# Patient Record
Sex: Male | Born: 1980 | Race: Black or African American | Hispanic: No | Marital: Single | Smoking: Former smoker
Health system: Southern US, Community
[De-identification: ages and names within clinical notes are randomized; demographics above are authoritative.]

## PROBLEM LIST (undated history)

## (undated) DIAGNOSIS — J45909 Unspecified asthma, uncomplicated: Secondary | ICD-10-CM

## (undated) HISTORY — PX: FOOT SURGERY: SHX648

## (undated) HISTORY — DX: Unspecified asthma, uncomplicated: J45.909

## (undated) HISTORY — PX: WISDOM TOOTH EXTRACTION: SHX21

---

## 2020-10-12 ENCOUNTER — Emergency Department (HOSPITAL_BASED_OUTPATIENT_CLINIC_OR_DEPARTMENT_OTHER)
Admission: EM | Admit: 2020-10-12 | Discharge: 2020-10-12 | Disposition: A | Discharge status: Home / Self Care | Payer: BC Managed Care – PPO | Attending: Emergency Medicine | Admitting: Emergency Medicine

## 2020-10-12 ENCOUNTER — Other Ambulatory Visit: Payer: Self-pay

## 2020-10-12 ENCOUNTER — Encounter (HOSPITAL_BASED_OUTPATIENT_CLINIC_OR_DEPARTMENT_OTHER): Payer: Self-pay | Admitting: Emergency Medicine

## 2020-10-12 ENCOUNTER — Emergency Department (HOSPITAL_BASED_OUTPATIENT_CLINIC_OR_DEPARTMENT_OTHER): Payer: BC Managed Care – PPO

## 2020-10-12 DIAGNOSIS — J45909 Unspecified asthma, uncomplicated: Secondary | ICD-10-CM | POA: Diagnosis not present

## 2020-10-12 DIAGNOSIS — S61011A Laceration without foreign body of right thumb without damage to nail, initial encounter: Secondary | ICD-10-CM

## 2020-10-12 DIAGNOSIS — Z87891 Personal history of nicotine dependence: Secondary | ICD-10-CM | POA: Insufficient documentation

## 2020-10-12 DIAGNOSIS — W260XXA Contact with knife, initial encounter: Secondary | ICD-10-CM | POA: Insufficient documentation

## 2020-10-12 DIAGNOSIS — Z23 Encounter for immunization: Secondary | ICD-10-CM | POA: Insufficient documentation

## 2020-10-12 MED ORDER — LIDOCAINE HCL (PF) 1 % IJ SOLN
5.0000 mL | Freq: Once | INTRAMUSCULAR | Status: AC
  Administered 2020-10-12: 11:00:00 5 mL
  Filled 2020-10-12: qty 5

## 2020-10-12 MED ORDER — TETANUS-DIPHTH-ACELL PERTUSSIS 5-2.5-18.5 LF-MCG/0.5 IM SUSY
0.5000 mL | PREFILLED_SYRINGE | Freq: Once | INTRAMUSCULAR | Status: AC
  Administered 2020-10-12: 11:00:00 0.5 mL via INTRAMUSCULAR
  Filled 2020-10-12: qty 0.5

## 2020-10-12 MED ORDER — CEPHALEXIN 500 MG PO CAPS: 500.0000 mg | ORAL_CAPSULE | Freq: Three times a day (TID) | ORAL | 0 refills | Status: AC

## 2020-10-12 NOTE — ED Provider Notes (Signed)
[facility] EMERGENCY DEPARTMENT Provider Note   CSN: [id number] Arrival date & time: 10/12/20  1032     History Chief Complaint  Patient presents with  . Laceration    Corey Grant is a 39 y.o. male  Patient presents to the ER today for laceration that occurred just prior to arrival.  He was closing his pocket knife when it slipped and he lacerated the pad of his right thumb.  He reports immediate onset sharp pain mild constant gradually improving with time pain does not radiate, no medication use prior to arrival.  Reports last tetanus shot over 10 years ago.  Denies any numbness/weakness, tingling, range of motion limitations or any additional concerns.  Of note patient reports that this is an old pocketknife that he was using.  HPI     Past Medical History:  Diagnosis Date  . Asthma     There are no problems to display for this patient.        No family history on file.  Social History   Tobacco Use  . Smoking status: Former Games developer  . Smokeless tobacco: Never Used  Vaping Use  . Vaping Use: Every day  Substance Use Topics  . Alcohol use: Yes    Alcohol/week: 2.0 standard drinks    Types: 2 Cans of beer per week    Comment: daily  . Drug use: Never    Home Medications Prior to Admission medications   Medication Sig Start Date End Date Taking? Authorizing Provider  cephALEXin (KEFLEX) 500 MG capsule Take 1 capsule (500 mg total) by mouth 3 (three) times daily for 7 days. 10/12/20 10/19/20  Bill Salinas, PA-C    Allergies    Patient has no allergy information on record.  Review of Systems   Review of Systems  Constitutional: Negative.  Negative for chills and fever.  Musculoskeletal: Negative.  Negative for arthralgias.  Skin: Positive for wound.  Neurological: Negative.  Negative for weakness and numbness.    Physical Exam Updated Vital Signs BP 135/90 (BP Location: Right Arm)   Pulse 90   Temp (!) 97.4 F (36.3 C) (Oral)    Resp 18   Ht 5\' 10"  (1.778 m)   Wt 98.9 kg   SpO2 100%   BMI 31.28 kg/m   Physical Exam Constitutional:      General: He is not in acute distress.    Appearance: Normal appearance. He is well-developed. He is not ill-appearing or diaphoretic.  HENT:     Head: Normocephalic and atraumatic.  Eyes:     General: Vision grossly intact. Gaze aligned appropriately.     Pupils: Pupils are equal, round, and reactive to light.  Neck:     Trachea: Trachea and phonation normal.  Cardiovascular:     Rate and Rhythm: Normal rate and regular rhythm.     Pulses:          Radial pulses are 2+ on the right side and 2+ on the left side.  Pulmonary:     Effort: Pulmonary effort is normal. No respiratory distress.  Abdominal:     General: There is no distension.     Palpations: Abdomen is soft.     Tenderness: There is no abdominal tenderness. There is no guarding or rebound.  Musculoskeletal:        General: Normal range of motion.       Hands:     Cervical back: Normal range of motion.  Comments: Right hand: 1.5 cm linear laceration of the pad of the right thumb extending from the center middle of the pad up to the very edge of the fingernail.  No other injuries.  Otherwise fingers appear normal.  Minimal tenderness to palpation over laceration. No snuffbox tenderness to palpation. No tenderness to palpation over flexor sheath.  Finger adduction/abduction intact with 5/5 strength.  Thumb opposition intact. Full active and resisted ROM to flexion/extension at wrist, MCP, PIP and DIP of all fingers.  FDS/FDP intact. Grip 5/5 strength.  Radial artery 2+ with <2sec cap refill in all fingers.  Sensation intact to light-tough in median/ulnar/radial distributions.  Skin:    General: Skin is warm and dry.  Neurological:     Mental Status: He is alert.     GCS: GCS eye subscore is 4. GCS verbal subscore is 5. GCS motor subscore is 6.     Comments: Speech is clear and goal oriented, follows  commands Major Cranial nerves without deficit, no facial droop Moves extremities without ataxia, coordination intact  Psychiatric:        Behavior: Behavior normal.     ED Results / Procedures / Treatments   Labs (all labs ordered are listed, but only abnormal results are displayed) Labs Reviewed - No data to display  EKG None  Radiology DG Finger Thumb Right  Result Date: 10/12/2020 CLINICAL DATA:  Right thumb laceration EXAM: RIGHT THUMB 2+V COMPARISON:  None. FINDINGS: There is no evidence of fracture or dislocation. There is no evidence of arthropathy or other focal bone abnormality. Soft tissues are unremarkable. No radiopaque foreign body. IMPRESSION: Negative. Electronically Signed   By: Duanne GuessNicholas  Plundo D.O.   On: 10/12/2020 11:13    Procedures .Marland Kitchen.Laceration Repair  Date/Time: 10/12/2020 11:54 AM Performed by: Bill SalinasMorelli, Josely Moffat A, PA-C Authorized by: Bill SalinasMorelli, Halina Asano A, PA-C   Consent:    Consent obtained:  Verbal   Consent given by:  Patient and spouse   Risks, benefits, and alternatives were discussed: yes     Risks discussed:  Pain, poor cosmetic result, poor wound healing, vascular damage, tendon damage, retained foreign body, infection, need for additional repair and nerve damage Universal protocol:    Procedure explained and questions answered to patient or proxy's satisfaction: yes     Relevant documents present and verified: yes     Test results available: yes     Imaging studies available: yes     Required blood products, implants, devices, and special equipment available: yes     Site/side marked: yes     Immediately prior to procedure, a time out was called: yes     Patient identity confirmed:  Verbally with patient and arm band Anesthesia:    Anesthesia method:  Nerve block   Block location:  Digital Block Right Thumb   Block needle gauge:  25 G   Block anesthetic:  Lidocaine 1% w/o epi   Block technique:  Digital   Block injection procedure:   Anatomic landmarks identified, anatomic landmarks palpated, negative aspiration for blood, introduced needle and incremental injection   Block outcome:  Anesthesia achieved Laceration details:    Location:  Finger   Finger location:  R thumb   Length (cm):  1.5   Depth (mm):  3 Pre-procedure details:    Preparation:  Patient was prepped and draped in usual sterile fashion and imaging obtained to evaluate for foreign bodies Exploration:    Limited defect created (wound extended): no     Imaging  obtained: x-ray     Imaging outcome: foreign body not noted     Wound exploration: wound explored through full range of motion and entire depth of wound visualized     Wound extent: no foreign bodies/material noted, no muscle damage noted, no nerve damage noted, no tendon damage noted, no underlying fracture noted and no vascular damage noted     Contaminated: no   Treatment:    Area cleansed with:  Povidone-iodine, Shur-Clens and saline   Amount of cleaning:  Standard   Irrigation solution:  Sterile saline   Irrigation method:  Pressure wash   Debridement:  None   Undermining:  None Skin repair:    Repair method:  Sutures   Suture size:  5-0   Suture material:  Prolene   Suture technique:  Simple interrupted   Number of sutures:  3 Approximation:    Approximation:  Close Repair type:    Repair type:  Simple Post-procedure details:    Dressing:  Antibiotic ointment, non-adherent dressing and sterile dressing   Procedure completion:  Tolerated well, no immediate complications Comments:     Dressing by nursing staff.   (including critical care time)  Medications Ordered in ED Medications  Tdap (BOOSTRIX) injection 0.5 mL (0.5 mLs Intramuscular Given 10/12/20 1110)  lidocaine (PF) (XYLOCAINE) 1 % injection 5 mL (5 mLs Infiltration Given by Other 10/12/20 1112)    ED Course  I have reviewed the triage vital signs and the nursing notes.  Pertinent labs & imaging results that were  available during my care of the patient were reviewed by me and considered in my medical decision making (see chart for details).    MDM Rules/Calculators/A&P                         Additional history obtained from: 1. Nursing notes from this visit. 2. Review of electronic medical record.  No pertinent recent ER visits. - WYLAND RASTETTER is a 39 y.o. male who presents to ED for laceration of the right thumb just prior to arrival.  Patient is neurovascularly intact to the thumb good range of motion and strength with all movements of the thumb.  Capillary refill and sensation intact.  DG Right Thumb:  IMPRESSION:  Negative.   I have personally reviewed patient's x-ray and agree with radiologist interpretation.  Tdap updated during visit today.  Wound thoroughly cleaned in ED today. Wound explored and bottom of wound seen in a bloodless field. Laceration repaired as dictated above.  Given that patient's pocketknife was old concern for possible infection will start patient on Keflex for infection prophylaxis.  Patient counseled on home wound care. Follow up with PCP/urgent care or return to ER for suture removal in 10 days. Patient was urged to return to the Emergency Department for worsening pain, swelling, expanding erythema especially if it streaks away from the affected area, fever, or for any additional concerns.  At this time there does not appear to be any evidence of an acute emergency medical condition and the patient appears stable for discharge with appropriate outpatient follow up. Diagnosis was discussed with patient who verbalizes understanding of care plan and is agreeable to discharge. I have discussed return precautions with patient who verbalizes understanding. Patient encouraged to follow-up with their PCP. All questions answered.  Patient's case discussed with Dr. Jacqulyn Bath who agrees with plan to discharge with follow-up.   Note: Portions of this report may have been  transcribed  using voice recognition software. Every effort was made to ensure accuracy; however, inadvertent computerized transcription errors may still be present. Final Clinical Impression(s) / ED Diagnoses Final diagnoses:  Laceration of right thumb without foreign body without damage to nail, initial encounter    Rx / DC Orders ED Discharge Orders         Ordered    cephALEXin (KEFLEX) 500 MG capsule  3 times daily        10/12/20 1202           Elizabeth Palau 10/12/20 1203    Maia Plan, MD 10/18/20 0425

## 2020-10-12 NOTE — Discharge Instructions (Signed)
At this time there does not appear to be the presence of an emergent medical condition, however there is always the potential for conditions to change. Please read and follow the below instructions.  Please return to the Emergency Department immediately for any new or worsening symptoms. Please be sure to follow up with your Primary Care Provider within one week regarding your visit today; please call their office to schedule an appointment even if you are feeling better for a follow-up visit. Please take your antibiotic Keflex as prescribed until complete to help with your symptoms.  Please drink enough water to avoid dehydration and get plenty of rest. Your stitches will need to be removed in 10 days.  You may have the stitches removed by your primary care provider, and urgent care or here at the emergency department.  Go to the nearest Emergency Department immediately if: You have fever or chills You have very bad swelling around the wound. Your pain suddenly gets worse and is very bad. You notice painful lumps near the wound or anywhere on your body. You have a red streak going away from your wound. The wound is on your hand or foot, and: You cannot move a finger or toe. Your fingers or toes look pale or bluish. You have any new/concerning or worsening of symptoms   Please read the additional information packets attached to your discharge summary.  Do not take your medicine if  develop an itchy rash, swelling in your mouth or lips, or difficulty breathing; call 911 and seek immediate emergency medical attention if this occurs.  You may review your lab tests and imaging results in their entirety on your MyChart account.  Please discuss all results of fully with your primary care provider and other specialist at your follow-up visit.  Note: Portions of this text may have been transcribed using voice recognition software. Every effort was made to ensure accuracy; however, inadvertent  computerized transcription errors may still be present.

## 2020-10-12 NOTE — ED Triage Notes (Signed)
Reports cutting his right thumb with a knife.  Last tetanus was about 10-11 years ago.

## 2022-04-22 IMAGING — DX DG FINGER THUMB 2+V*R*
3 series · 3 of 3 positions shown · non-contrast
Comparison: None.

CLINICAL DATA: Right thumb laceration

EXAM:
RIGHT THUMB 2+V

[finger ap]
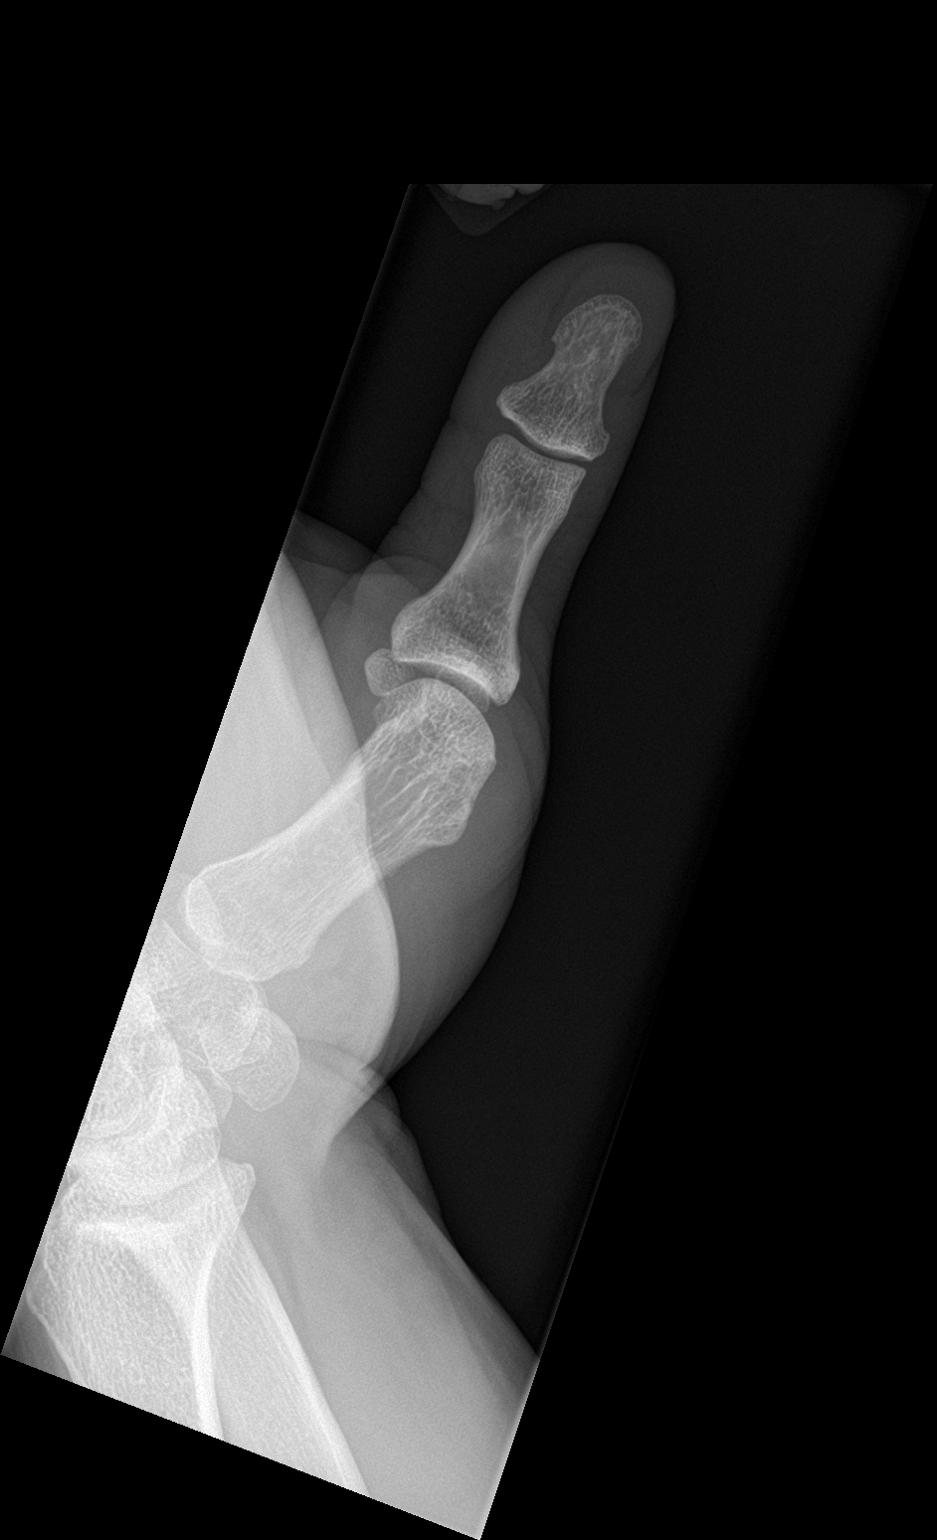

[finger obl]
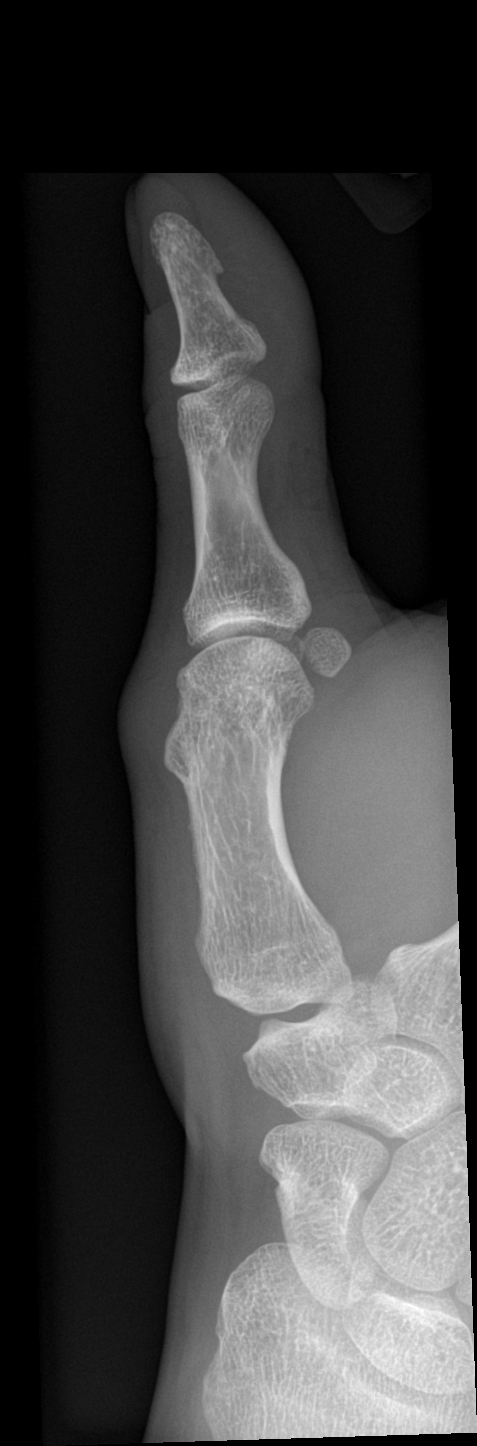

[finger lat]
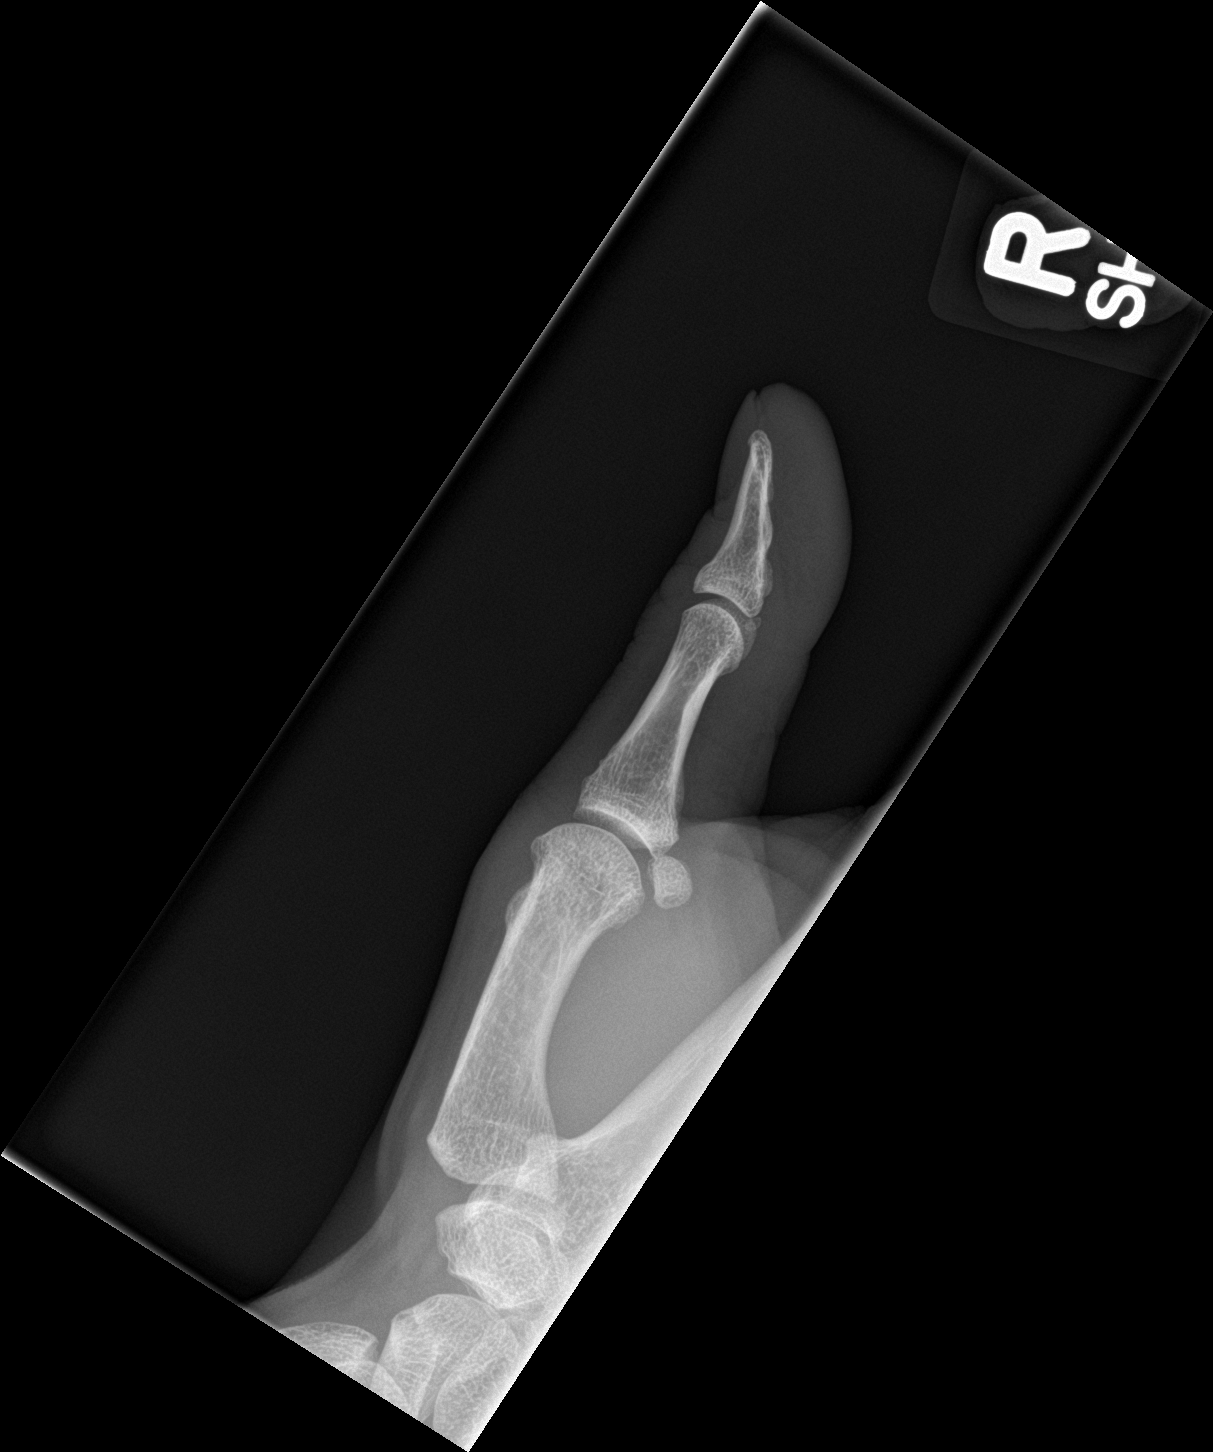

[3 of 3 positions shown; findings below may reference images not displayed]

FINDINGS: There is no evidence of fracture or dislocation. There is no
evidence of arthropathy or other focal bone abnormality. Soft
tissues are unremarkable. No radiopaque foreign body.
IMPRESSION: Negative.
